# Patient Record
Sex: Female | Born: 1955 | Race: White | Hispanic: No | Marital: Married | Smoking: Former smoker
Health system: Southern US, Community
[De-identification: ages and names within clinical notes are randomized; demographics above are authoritative.]

## PROBLEM LIST (undated history)

## (undated) DIAGNOSIS — M549 Dorsalgia, unspecified: Secondary | ICD-10-CM

## (undated) HISTORY — PX: OTHER SURGICAL HISTORY: SHX169

## (undated) HISTORY — PX: KNEE ARTHROSCOPY WITH EXCISION BAKER'S CYST: SHX5646

---

## 1998-01-14 ENCOUNTER — Other Ambulatory Visit: Admission: RE | Admit: 1998-01-14 | Discharge: 1998-01-14 | Payer: Self-pay | Admitting: Gynecology

## 1999-01-19 ENCOUNTER — Other Ambulatory Visit: Admission: RE | Admit: 1999-01-19 | Discharge: 1999-01-19 | Payer: Self-pay | Admitting: Gynecology

## 2000-02-16 ENCOUNTER — Other Ambulatory Visit: Admission: RE | Admit: 2000-02-16 | Discharge: 2000-02-16 | Payer: Self-pay | Admitting: Gynecology

## 2001-02-27 ENCOUNTER — Other Ambulatory Visit: Admission: RE | Admit: 2001-02-27 | Discharge: 2001-02-27 | Payer: Self-pay | Admitting: Gynecology

## 2002-02-27 ENCOUNTER — Other Ambulatory Visit: Admission: RE | Admit: 2002-02-27 | Discharge: 2002-02-27 | Payer: Self-pay | Admitting: Gynecology

## 2003-03-11 ENCOUNTER — Other Ambulatory Visit: Admission: RE | Admit: 2003-03-11 | Discharge: 2003-03-11 | Payer: Self-pay | Admitting: Gynecology

## 2004-03-12 ENCOUNTER — Other Ambulatory Visit: Admission: RE | Admit: 2004-03-12 | Discharge: 2004-03-12 | Payer: Self-pay | Admitting: Gynecology

## 2005-03-15 ENCOUNTER — Other Ambulatory Visit: Admission: RE | Admit: 2005-03-15 | Discharge: 2005-03-15 | Payer: Self-pay | Admitting: Gynecology

## 2006-09-02 ENCOUNTER — Encounter: Admission: RE | Admit: 2006-09-02 | Discharge: 2006-09-02 | Payer: Self-pay | Admitting: Internal Medicine

## 2008-03-24 IMAGING — CR DG HAND COMPLETE 3+V*L*
3 series · 3 of 3 positions shown · non-contrast
Comparison: none

CLINICAL DATA: Pain left 1st and 2nd metacarpal bones. 
 LEFT HAND ? 3 VIEW:

[x hand pa left]
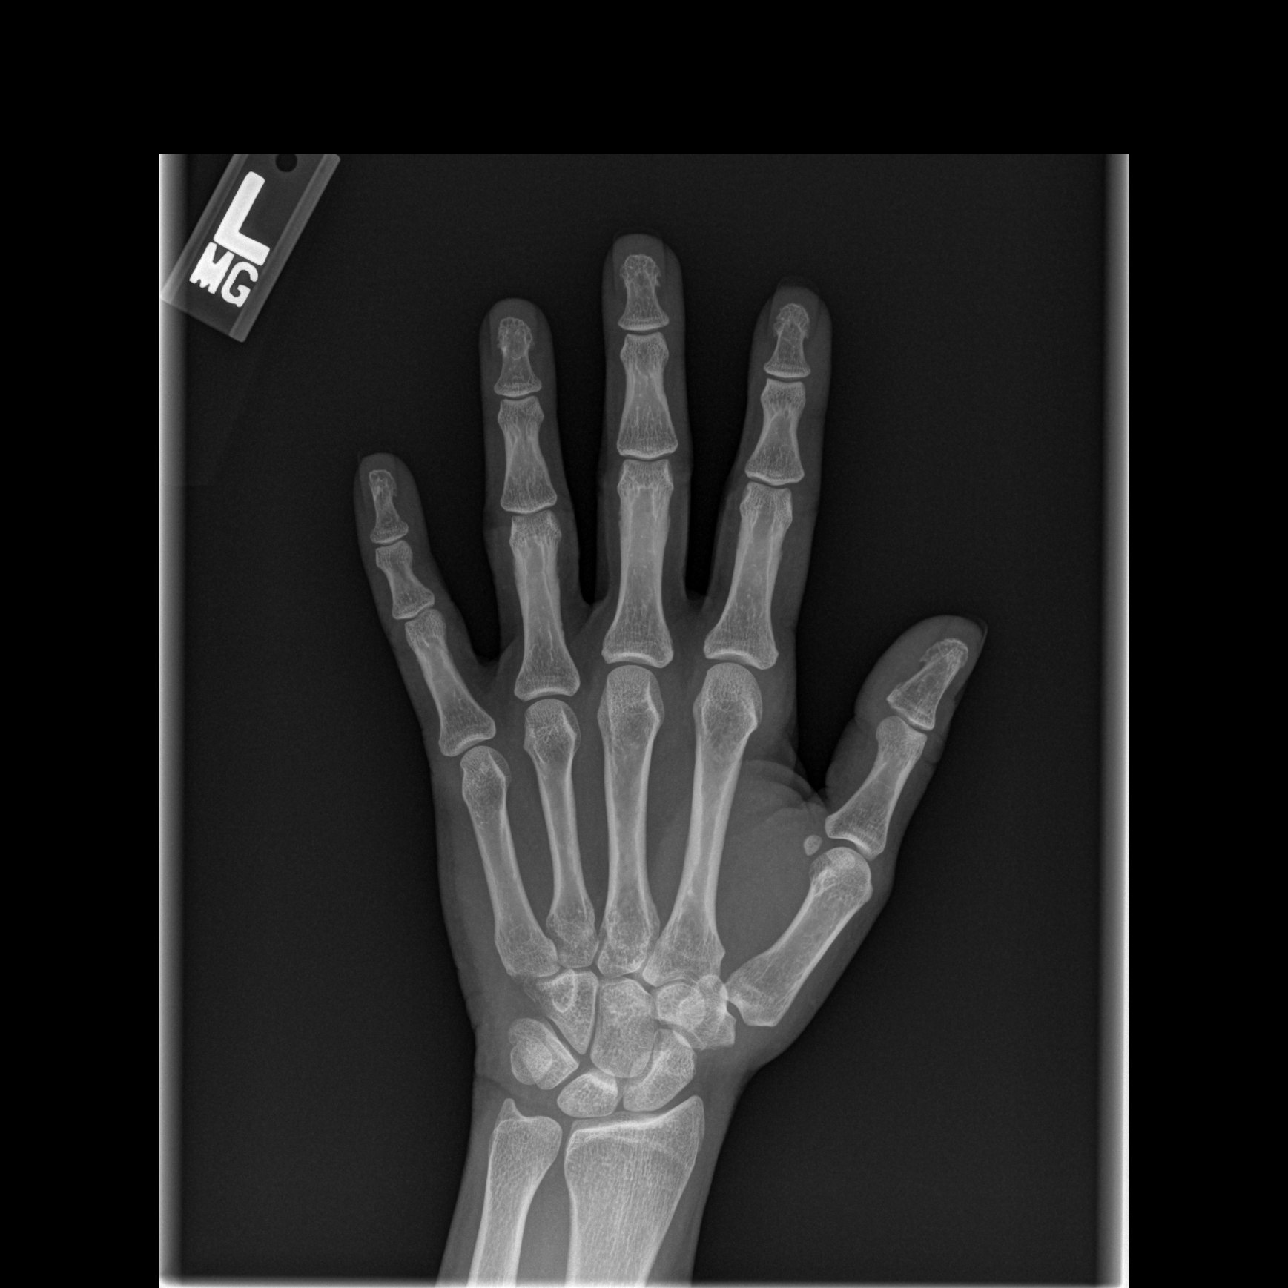

[x hand oblique left]
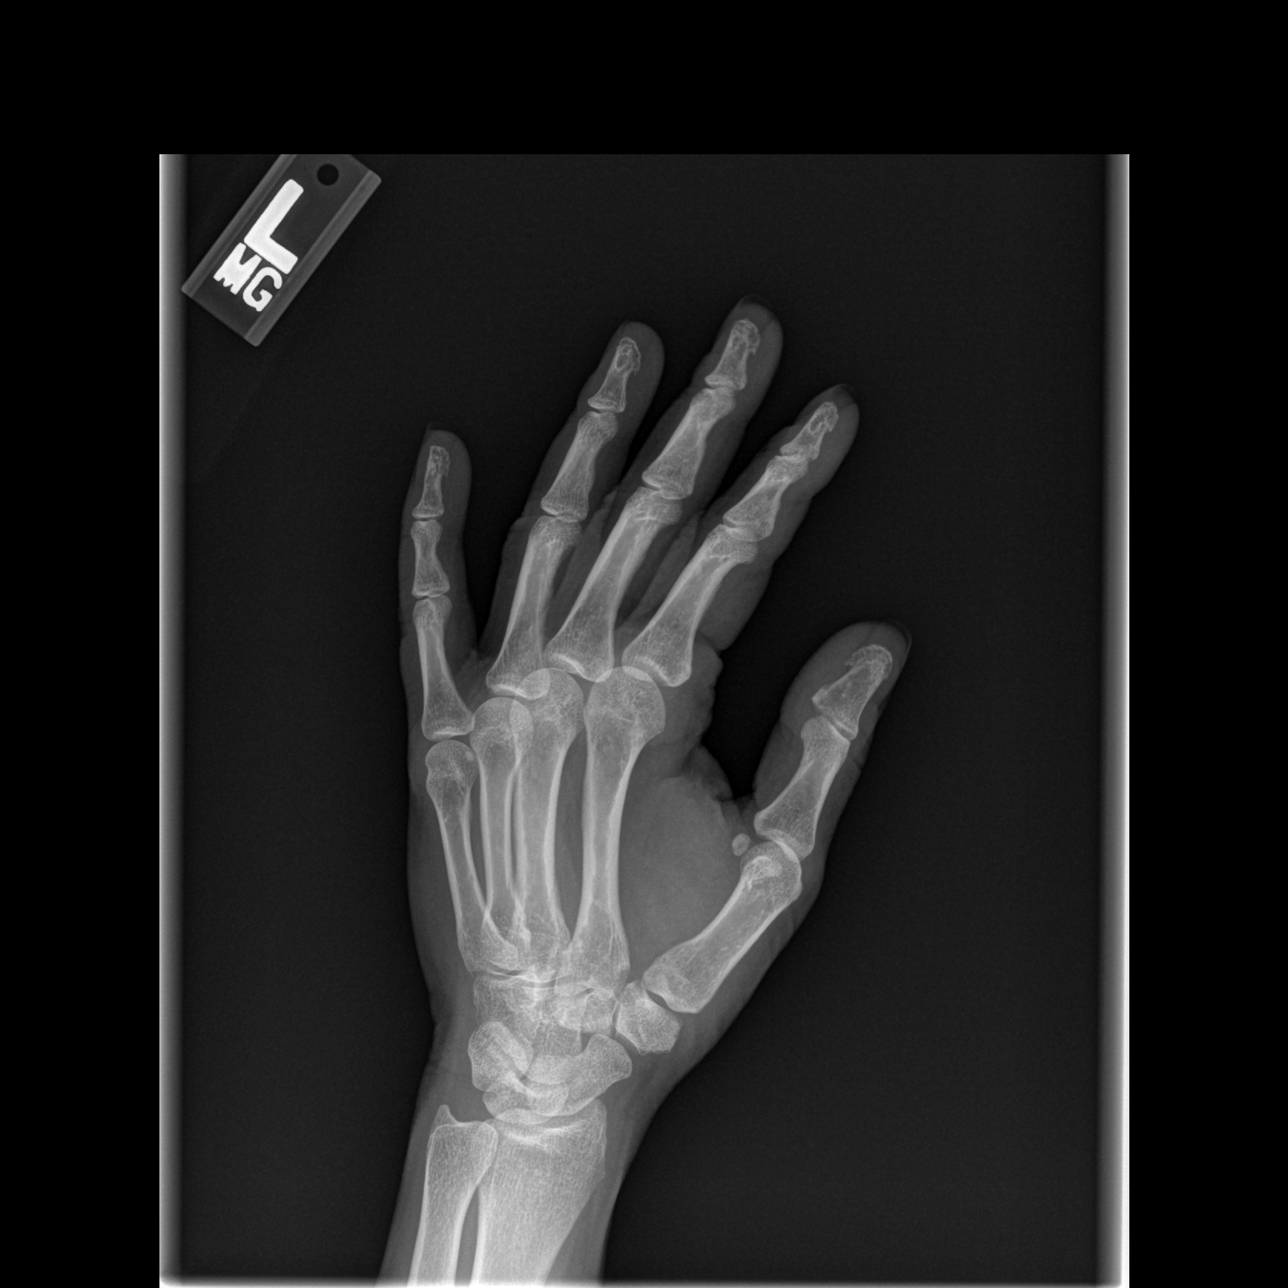

[x hand lat left]
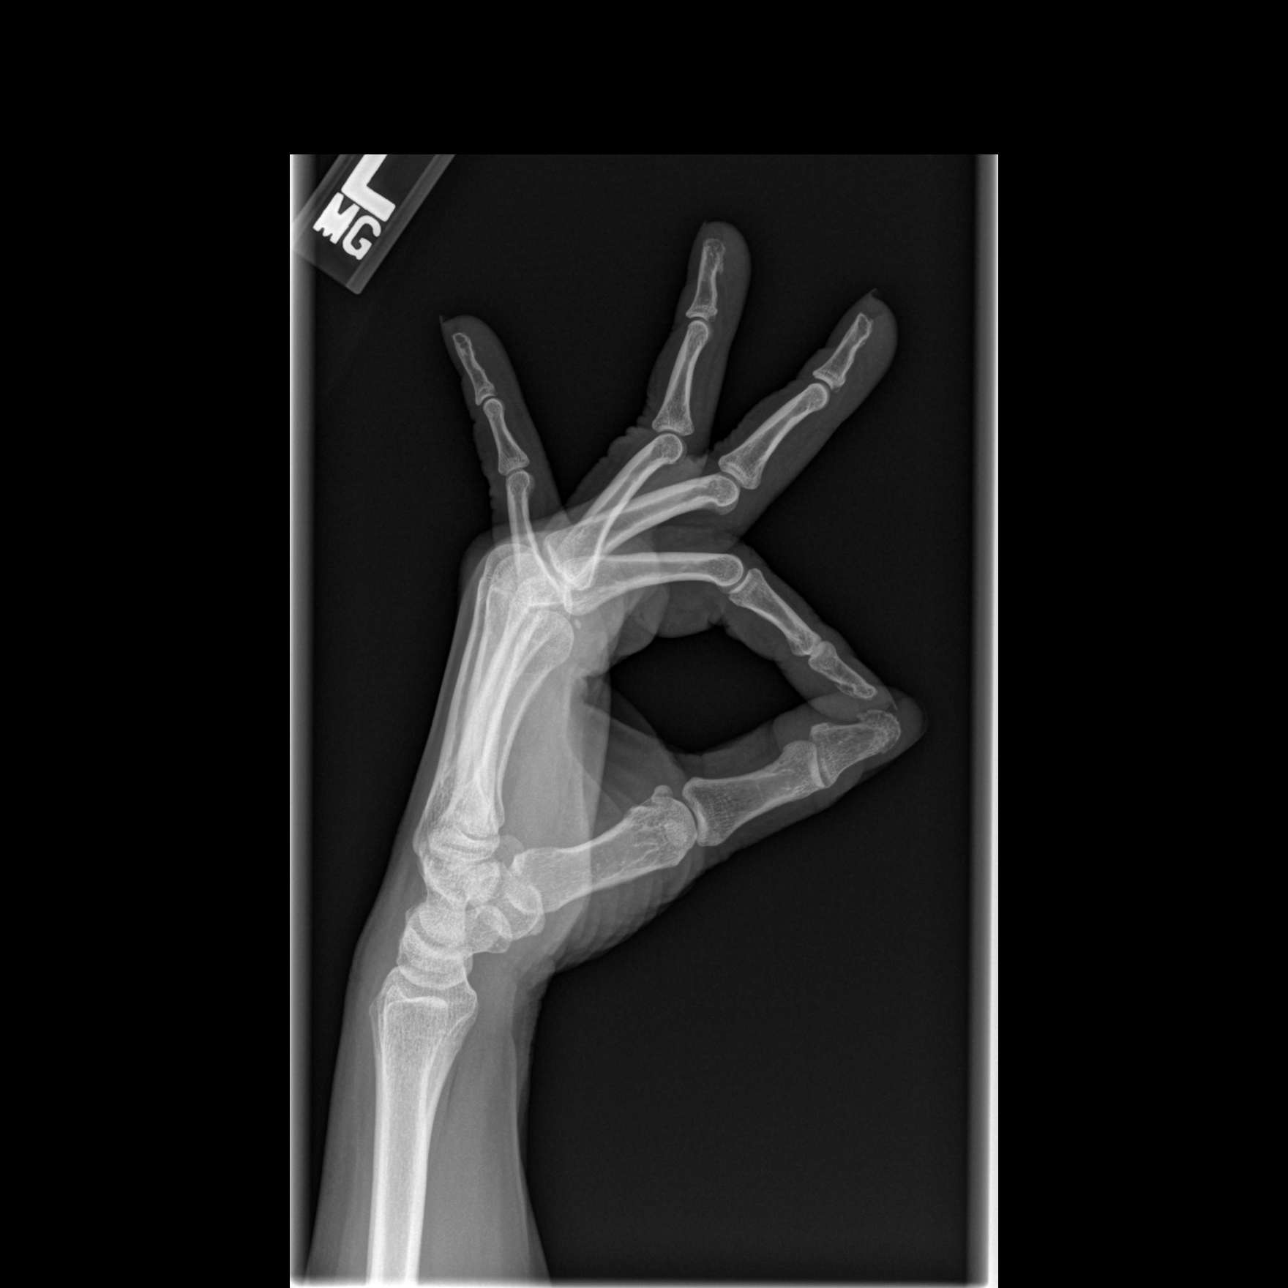

[3 of 3 positions shown; findings below may reference images not displayed]

FINDINGS: No acute bone or soft tissue abnormalities are present.
IMPRESSION: Negative left hand.

## 2008-03-24 IMAGING — CR DG PELVIS 1-2V
1 series · 1 of 1 positions shown · non-contrast
Comparison: none

CLINICAL DATA: 50-year-old female, left pelvic pain.
 PELVIS ? 1 VIEW:

[t pelvis a.p.]
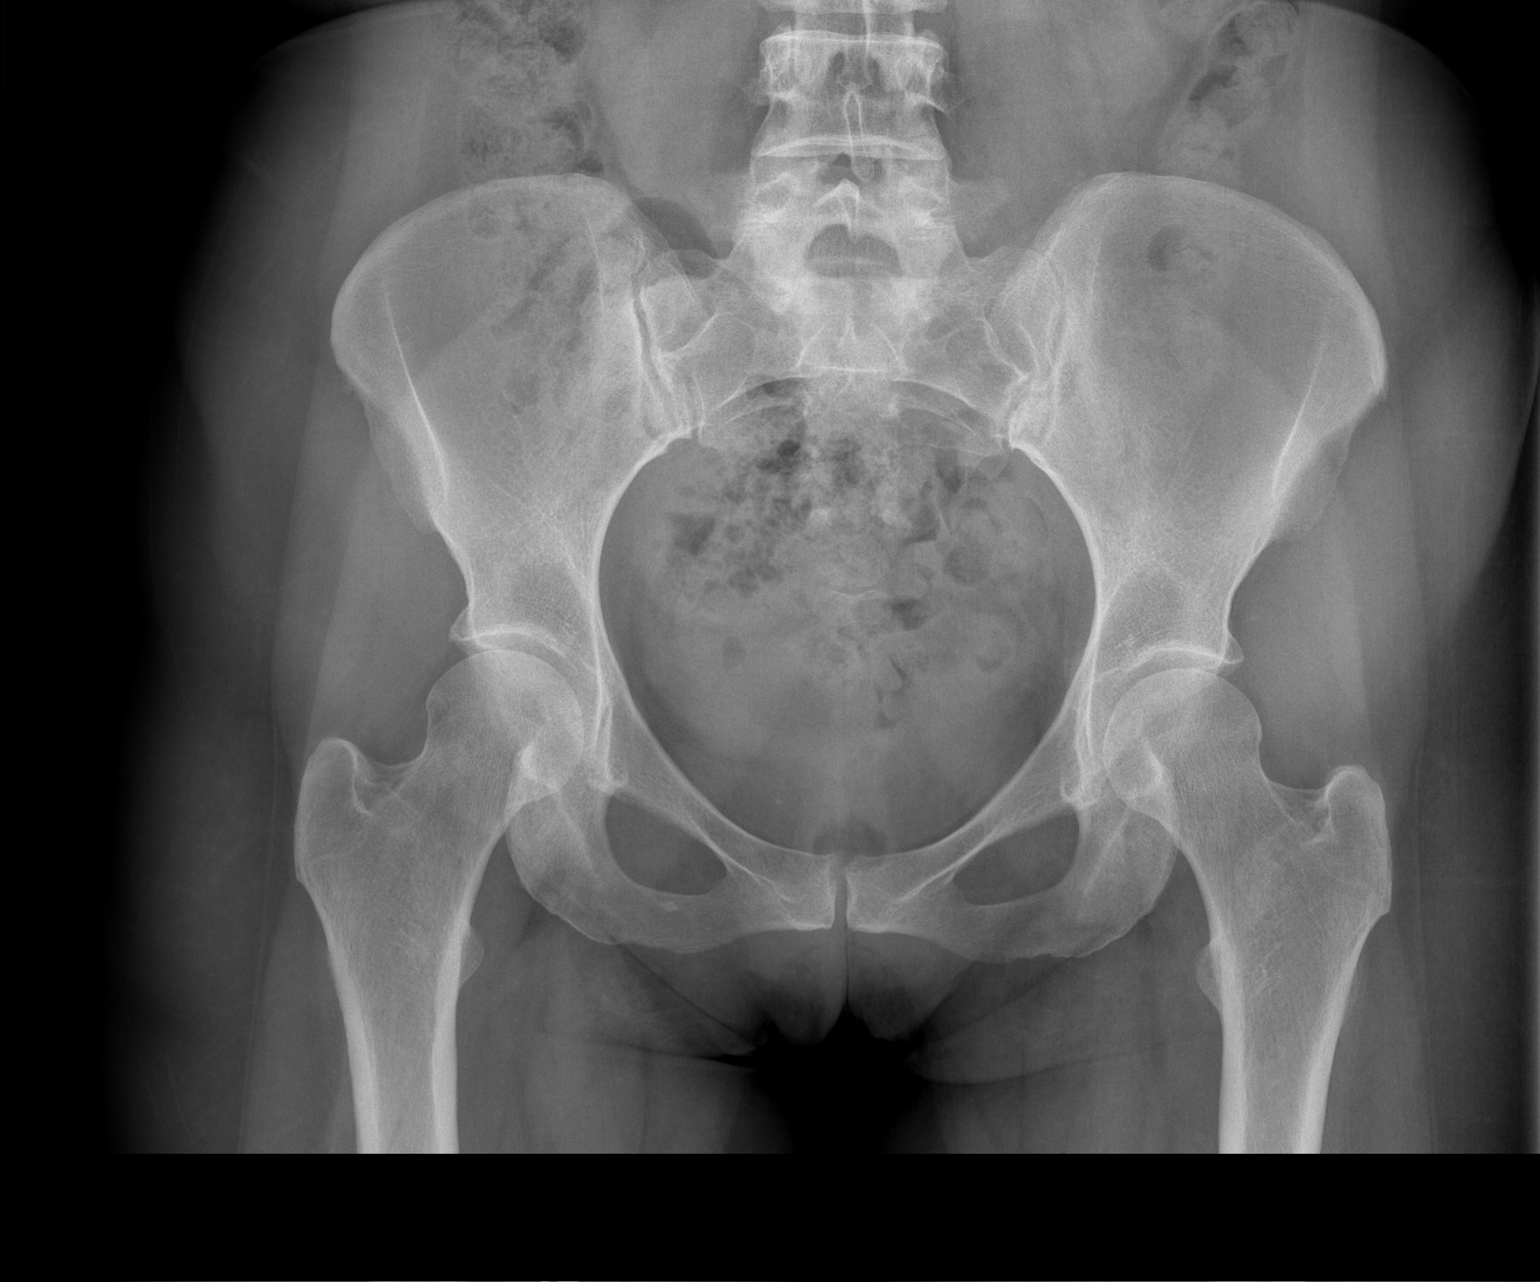

[1 of 1 positions shown; findings below may reference images not displayed]

FINDINGS: Hips are located.  No acute bone or soft tissue abnormality is identified.  Moderate amount of stool is seen throughout the colon.
IMPRESSION: Negative pelvis.

## 2008-07-25 ENCOUNTER — Encounter: Admission: RE | Admit: 2008-07-25 | Discharge: 2008-09-20 | Payer: Self-pay | Admitting: Orthopedic Surgery

## 2009-04-22 ENCOUNTER — Encounter: Admission: RE | Admit: 2009-04-22 | Discharge: 2009-04-22 | Payer: Self-pay | Admitting: Gynecology

## 2009-11-21 ENCOUNTER — Encounter: Admission: RE | Admit: 2009-11-21 | Discharge: 2009-11-21 | Payer: Self-pay | Admitting: Gynecology

## 2011-08-02 ENCOUNTER — Other Ambulatory Visit: Payer: Self-pay | Admitting: Orthopedic Surgery

## 2011-08-02 ENCOUNTER — Ambulatory Visit
Admission: RE | Admit: 2011-08-02 | Discharge: 2011-08-02 | Disposition: A | Discharge status: Home / Self Care | Payer: BC Managed Care – PPO | Source: Ambulatory Visit | Attending: Orthopedic Surgery | Admitting: Orthopedic Surgery

## 2011-08-02 DIAGNOSIS — IMO0002 Reserved for concepts with insufficient information to code with codable children: Secondary | ICD-10-CM

## 2012-01-13 ENCOUNTER — Other Ambulatory Visit (HOSPITAL_COMMUNITY): Payer: Self-pay | Admitting: Gynecology

## 2012-01-13 DIAGNOSIS — N950 Postmenopausal bleeding: Secondary | ICD-10-CM

## 2012-01-17 ENCOUNTER — Ambulatory Visit (HOSPITAL_COMMUNITY)
Admission: RE | Admit: 2012-01-17 | Discharge: 2012-01-17 | Disposition: A | Discharge status: Home / Self Care | Payer: BC Managed Care – PPO | Source: Ambulatory Visit | Attending: Gynecology | Admitting: Gynecology

## 2012-01-17 DIAGNOSIS — N838 Other noninflammatory disorders of ovary, fallopian tube and broad ligament: Secondary | ICD-10-CM | POA: Insufficient documentation

## 2012-01-17 DIAGNOSIS — N950 Postmenopausal bleeding: Secondary | ICD-10-CM | POA: Insufficient documentation

## 2012-01-17 DIAGNOSIS — N83209 Unspecified ovarian cyst, unspecified side: Secondary | ICD-10-CM | POA: Insufficient documentation

## 2012-04-13 ENCOUNTER — Ambulatory Visit: Payer: BC Managed Care – PPO | Admitting: Physical Therapy

## 2012-04-27 ENCOUNTER — Other Ambulatory Visit: Payer: Self-pay | Admitting: Gynecology

## 2012-04-27 DIAGNOSIS — R928 Other abnormal and inconclusive findings on diagnostic imaging of breast: Secondary | ICD-10-CM

## 2012-05-04 ENCOUNTER — Ambulatory Visit
Admission: RE | Admit: 2012-05-04 | Discharge: 2012-05-04 | Disposition: A | Discharge status: Home / Self Care | Payer: BC Managed Care – PPO | Source: Ambulatory Visit | Attending: Gynecology | Admitting: Gynecology

## 2012-05-04 DIAGNOSIS — R928 Other abnormal and inconclusive findings on diagnostic imaging of breast: Secondary | ICD-10-CM

## 2012-05-05 ENCOUNTER — Other Ambulatory Visit: Payer: BC Managed Care – PPO

## 2012-09-22 ENCOUNTER — Ambulatory Visit (INDEPENDENT_AMBULATORY_CARE_PROVIDER_SITE_OTHER): Payer: BC Managed Care – PPO | Admitting: Physical Therapy

## 2012-09-22 DIAGNOSIS — M47817 Spondylosis without myelopathy or radiculopathy, lumbosacral region: Secondary | ICD-10-CM

## 2012-09-22 DIAGNOSIS — M545 Low back pain, unspecified: Secondary | ICD-10-CM

## 2012-09-22 DIAGNOSIS — M2560 Stiffness of unspecified joint, not elsewhere classified: Secondary | ICD-10-CM

## 2012-09-22 DIAGNOSIS — M25559 Pain in unspecified hip: Secondary | ICD-10-CM

## 2012-09-25 ENCOUNTER — Encounter (INDEPENDENT_AMBULATORY_CARE_PROVIDER_SITE_OTHER): Payer: BC Managed Care – PPO | Admitting: Physical Therapy

## 2012-09-25 DIAGNOSIS — M545 Low back pain, unspecified: Secondary | ICD-10-CM

## 2012-09-25 DIAGNOSIS — M25559 Pain in unspecified hip: Secondary | ICD-10-CM

## 2012-09-25 DIAGNOSIS — M2560 Stiffness of unspecified joint, not elsewhere classified: Secondary | ICD-10-CM

## 2012-09-25 DIAGNOSIS — M47817 Spondylosis without myelopathy or radiculopathy, lumbosacral region: Secondary | ICD-10-CM

## 2012-10-05 ENCOUNTER — Encounter (INDEPENDENT_AMBULATORY_CARE_PROVIDER_SITE_OTHER): Payer: BC Managed Care – PPO | Admitting: Physical Therapy

## 2012-10-05 DIAGNOSIS — M545 Low back pain, unspecified: Secondary | ICD-10-CM

## 2012-10-05 DIAGNOSIS — M47817 Spondylosis without myelopathy or radiculopathy, lumbosacral region: Secondary | ICD-10-CM

## 2012-10-05 DIAGNOSIS — M2560 Stiffness of unspecified joint, not elsewhere classified: Secondary | ICD-10-CM

## 2012-10-05 DIAGNOSIS — M25559 Pain in unspecified hip: Secondary | ICD-10-CM

## 2012-10-09 ENCOUNTER — Encounter (INDEPENDENT_AMBULATORY_CARE_PROVIDER_SITE_OTHER): Payer: BC Managed Care – PPO | Admitting: Physical Therapy

## 2012-10-09 DIAGNOSIS — M47817 Spondylosis without myelopathy or radiculopathy, lumbosacral region: Secondary | ICD-10-CM

## 2012-10-09 DIAGNOSIS — M25559 Pain in unspecified hip: Secondary | ICD-10-CM

## 2012-10-09 DIAGNOSIS — M545 Low back pain, unspecified: Secondary | ICD-10-CM

## 2012-10-09 DIAGNOSIS — M2560 Stiffness of unspecified joint, not elsewhere classified: Secondary | ICD-10-CM

## 2012-10-12 ENCOUNTER — Encounter (INDEPENDENT_AMBULATORY_CARE_PROVIDER_SITE_OTHER): Payer: BC Managed Care – PPO | Admitting: Physical Therapy

## 2012-10-12 DIAGNOSIS — M47817 Spondylosis without myelopathy or radiculopathy, lumbosacral region: Secondary | ICD-10-CM

## 2012-10-12 DIAGNOSIS — M25559 Pain in unspecified hip: Secondary | ICD-10-CM

## 2012-10-12 DIAGNOSIS — M2560 Stiffness of unspecified joint, not elsewhere classified: Secondary | ICD-10-CM

## 2012-10-12 DIAGNOSIS — M545 Low back pain, unspecified: Secondary | ICD-10-CM

## 2012-10-19 ENCOUNTER — Encounter (INDEPENDENT_AMBULATORY_CARE_PROVIDER_SITE_OTHER): Payer: BC Managed Care – PPO | Admitting: Physical Therapy

## 2012-10-19 DIAGNOSIS — M47817 Spondylosis without myelopathy or radiculopathy, lumbosacral region: Secondary | ICD-10-CM

## 2012-10-19 DIAGNOSIS — M2560 Stiffness of unspecified joint, not elsewhere classified: Secondary | ICD-10-CM

## 2012-10-19 DIAGNOSIS — M545 Low back pain, unspecified: Secondary | ICD-10-CM

## 2012-10-19 DIAGNOSIS — M25559 Pain in unspecified hip: Secondary | ICD-10-CM

## 2012-10-23 ENCOUNTER — Encounter: Payer: BC Managed Care – PPO | Admitting: Physical Therapy

## 2012-10-26 ENCOUNTER — Encounter: Payer: BC Managed Care – PPO | Admitting: Physical Therapy

## 2012-10-30 ENCOUNTER — Encounter: Payer: BC Managed Care – PPO | Admitting: Physical Therapy

## 2012-11-03 ENCOUNTER — Encounter: Payer: BC Managed Care – PPO | Admitting: Physical Therapy

## 2012-11-06 ENCOUNTER — Encounter: Payer: BC Managed Care – PPO | Admitting: Physical Therapy

## 2013-05-08 ENCOUNTER — Other Ambulatory Visit: Payer: Self-pay | Admitting: Obstetrics and Gynecology

## 2013-05-09 ENCOUNTER — Encounter (HOSPITAL_COMMUNITY): Payer: Self-pay | Admitting: Pharmacist

## 2013-05-10 ENCOUNTER — Encounter (HOSPITAL_COMMUNITY)
Admission: RE | Admit: 2013-05-10 | Discharge: 2013-05-10 | Disposition: A | Discharge status: Home / Self Care | Payer: BC Managed Care – PPO | Source: Ambulatory Visit | Attending: Obstetrics and Gynecology | Admitting: Obstetrics and Gynecology

## 2013-05-10 ENCOUNTER — Encounter (HOSPITAL_COMMUNITY): Payer: Self-pay

## 2013-05-10 DIAGNOSIS — Z01812 Encounter for preprocedural laboratory examination: Secondary | ICD-10-CM | POA: Insufficient documentation

## 2013-05-10 HISTORY — DX: Dorsalgia, unspecified: M54.9

## 2013-05-10 LAB — CBC
HCT: 41.5 % (ref 36.0–46.0)
MCH: 33 pg (ref 26.0–34.0)
MCHC: 34.2 g/dL (ref 30.0–36.0)
MCV: 96.5 fL (ref 78.0–100.0)
RBC: 4.3 MIL/uL (ref 3.87–5.11)

## 2013-05-10 LAB — BASIC METABOLIC PANEL
Calcium: 9.4 mg/dL (ref 8.4–10.5)
Creatinine, Ser: 0.52 mg/dL (ref 0.50–1.10)
GFR calc Af Amer: >90 mL/min (ref >90)
GFR calc non Af Amer: >90 mL/min (ref >90)
Glucose, Bld: 86 mg/dL (ref 70–99)
Potassium: 3.7 mEq/L (ref 3.5–5.1)
Sodium: 141 mEq/L (ref 135–145)

## 2013-05-10 NOTE — Patient Instructions (Signed)
21 Gloria Roberts  05/10/2013   Your procedure is scheduled on:  05/14/13  Enter through the Main Entrance of [facility] at 10 AM.  Pick up the phone at the desk and dial [phone].   Call this number if you have problems the morning of surgery: [phone]   Remember:   Do not eat food:After Midnight.  Do not drink clear liquids: After Midnight.  Take these medicines the morning of surgery with A SIP OF WATER: NA   Do not wear jewelry, make-up or nail polish.  Do not wear lotions, powders, or perfumes. You may wear deodorant.  Do not shave 48 hours prior to surgery.  Do not bring valuables to the hospital.  [facility] is not   responsible for any belongings or valuables brought to the hospital.  Contacts, dentures or bridgework may not be worn into surgery.  Leave suitcase in the car. After surgery it may be brought to your room.  For patients admitted to the hospital, checkout time is 11:00 AM the day of              discharge.   Patients discharged the day of surgery will not be allowed to drive             home.  Name and phone number of your driver: husband  [name]  Special Instructions:   Shower using CHG 2 nights before surgery and the night before surgery.  If you shower the day of surgery use CHG.  Use special wash - you have one bottle of CHG for all showers.  You should use approximately 1/3 of the bottle for each shower.   Please read over the following fact sheets that you were given:   Surgical Site Infection Prevention

## 2013-05-14 ENCOUNTER — Encounter (HOSPITAL_COMMUNITY): Payer: BC Managed Care – PPO | Admitting: Anesthesiology

## 2013-05-14 ENCOUNTER — Ambulatory Visit (HOSPITAL_COMMUNITY): Payer: BC Managed Care – PPO | Admitting: Anesthesiology

## 2013-05-14 ENCOUNTER — Encounter (HOSPITAL_COMMUNITY): Payer: Self-pay | Admitting: Anesthesiology

## 2013-05-14 ENCOUNTER — Ambulatory Visit (HOSPITAL_COMMUNITY)
Admission: RE | Admit: 2013-05-14 | Discharge: 2013-05-14 | Disposition: A | Discharge status: Home / Self Care | Payer: BC Managed Care – PPO | Source: Ambulatory Visit | Attending: Obstetrics and Gynecology | Admitting: Obstetrics and Gynecology

## 2013-05-14 ENCOUNTER — Encounter (HOSPITAL_COMMUNITY)
Admission: RE | Disposition: A | Discharge status: Home / Self Care | Payer: Self-pay | Source: Ambulatory Visit | Attending: Obstetrics and Gynecology

## 2013-05-14 DIAGNOSIS — N950 Postmenopausal bleeding: Secondary | ICD-10-CM

## 2013-05-14 DIAGNOSIS — N841 Polyp of cervix uteri: Secondary | ICD-10-CM | POA: Insufficient documentation

## 2013-05-14 HISTORY — PX: HYSTEROSCOPY WITH D & C: SHX1775

## 2013-05-14 SURGERY — DILATATION AND CURETTAGE /HYSTEROSCOPY
Anesthesia: General | Site: Vagina

## 2013-05-14 MED ORDER — KETOROLAC TROMETHAMINE 30 MG/ML IJ SOLN
INTRAMUSCULAR | Status: DC | PRN
  Administered 2013-05-14: 30 mg via INTRAMUSCULAR

## 2013-05-14 MED ORDER — ONDANSETRON HCL 4 MG/2ML IJ SOLN
INTRAMUSCULAR | Status: DC | PRN
  Administered 2013-05-14: 4 mg via INTRAVENOUS

## 2013-05-14 MED ORDER — LIDOCAINE HCL 1 % IJ SOLN
INTRAMUSCULAR | Status: DC | PRN
  Administered 2013-05-14: 10 mL

## 2013-05-14 MED ORDER — PROPOFOL 10 MG/ML IV BOLUS
INTRAVENOUS | Status: DC | PRN
  Administered 2013-05-14: 180 mg via INTRAVENOUS

## 2013-05-14 MED ORDER — GLYCINE 1.5 % IR SOLN
Status: DC | PRN
  Administered 2013-05-14: 3000 mL

## 2013-05-14 MED ORDER — LACTATED RINGERS IV SOLN
INTRAVENOUS | Status: DC
  Administered 2013-05-14: 11:00:00 via INTRAVENOUS

## 2013-05-14 MED ORDER — FENTANYL CITRATE 0.05 MG/ML IJ SOLN
INTRAMUSCULAR | Status: AC
  Filled 2013-05-14: qty 2

## 2013-05-14 MED ORDER — KETOROLAC TROMETHAMINE 30 MG/ML IJ SOLN
INTRAMUSCULAR | Status: AC
  Filled 2013-05-14: qty 1

## 2013-05-14 MED ORDER — MIDAZOLAM HCL 2 MG/2ML IJ SOLN
INTRAMUSCULAR | Status: AC
  Filled 2013-05-14: qty 2

## 2013-05-14 MED ORDER — LIDOCAINE HCL 1 % IJ SOLN
INTRAMUSCULAR | Status: AC
  Filled 2013-05-14: qty 20

## 2013-05-14 MED ORDER — LIDOCAINE HCL (CARDIAC) 20 MG/ML IV SOLN
INTRAVENOUS | Status: AC
  Filled 2013-05-14: qty 5

## 2013-05-14 MED ORDER — PROPOFOL 10 MG/ML IV EMUL
INTRAVENOUS | Status: AC
  Filled 2013-05-14: qty 20

## 2013-05-14 MED ORDER — MIDAZOLAM HCL 2 MG/2ML IJ SOLN
INTRAMUSCULAR | Status: DC | PRN
  Administered 2013-05-14: 2 mg via INTRAVENOUS

## 2013-05-14 MED ORDER — KETOROLAC TROMETHAMINE 30 MG/ML IJ SOLN: 15.0000 mg | Freq: Once | INTRAMUSCULAR | Status: DC | PRN

## 2013-05-14 MED ORDER — ONDANSETRON HCL 4 MG/2ML IJ SOLN
INTRAMUSCULAR | Status: AC
  Filled 2013-05-14: qty 2

## 2013-05-14 MED ORDER — CEFAZOLIN SODIUM-DEXTROSE 2-3 GM-% IV SOLR
2.0000 g | INTRAVENOUS | Status: AC
  Administered 2013-05-14: 2 g via INTRAVENOUS

## 2013-05-14 MED ORDER — FENTANYL CITRATE 0.05 MG/ML IJ SOLN
INTRAMUSCULAR | Status: DC | PRN
  Administered 2013-05-14 (×4): 50 ug via INTRAVENOUS

## 2013-05-14 MED ORDER — METOCLOPRAMIDE HCL 5 MG/ML IJ SOLN: 10.0000 mg | Freq: Once | INTRAMUSCULAR | Status: DC | PRN

## 2013-05-14 MED ORDER — LIDOCAINE HCL (CARDIAC) 20 MG/ML IV SOLN
INTRAVENOUS | Status: DC | PRN
  Administered 2013-05-14: 60 mg via INTRAVENOUS

## 2013-05-14 MED ORDER — FENTANYL CITRATE 0.05 MG/ML IJ SOLN: 25.0000 ug | INTRAMUSCULAR | Status: DC | PRN

## 2013-05-14 MED ORDER — LACTATED RINGERS IV SOLN
INTRAVENOUS | Status: DC | PRN
  Administered 2013-05-14 (×3): via INTRAVENOUS

## 2013-05-14 MED ORDER — SCOPOLAMINE 1 MG/3DAYS TD PT72
MEDICATED_PATCH | TRANSDERMAL | Status: AC
  Administered 2013-05-14: 1.5 mg
  Filled 2013-05-14: qty 1

## 2013-05-14 MED ORDER — CEFAZOLIN SODIUM-DEXTROSE 2-3 GM-% IV SOLR
INTRAVENOUS | Status: AC
  Filled 2013-05-14: qty 50

## 2013-05-14 SURGICAL SUPPLY — 16 items
CANISTER SUCT 3000ML (MISCELLANEOUS) ×2 IMPLANT
CATH ROBINSON RED A/P 16FR (CATHETERS) ×2 IMPLANT
CLOTH BEACON ORANGE TIMEOUT ST (SAFETY) ×2 IMPLANT
CONTAINER PREFILL 10% NBF 60ML (FORM) ×4 IMPLANT
DRSG TELFA 3X8 NADH (GAUZE/BANDAGES/DRESSINGS) ×2 IMPLANT
ELECT REM PT RETURN 9FT ADLT (ELECTROSURGICAL) ×2
ELECTRODE REM PT RTRN 9FT ADLT (ELECTROSURGICAL) ×1 IMPLANT
GLOVE ECLIPSE 7.0 STRL STRAW (GLOVE) ×4 IMPLANT
GOWN PREVENTION PLUS XLARGE (GOWN DISPOSABLE) ×2 IMPLANT
GOWN STRL REIN XL XLG (GOWN DISPOSABLE) ×4 IMPLANT
LOOP ANGLED CUTTING 22FR (CUTTING LOOP) IMPLANT
PACK HYSTEROSCOPY LF (CUSTOM PROCEDURE TRAY) ×2 IMPLANT
PAD DRESSING TELFA 3X8 NADH (GAUZE/BANDAGES/DRESSINGS) ×1 IMPLANT
PAD OB MATERNITY 4.3X12.25 (PERSONAL CARE ITEMS) ×2 IMPLANT
TOWEL OR 17X24 6PK STRL BLUE (TOWEL DISPOSABLE) ×4 IMPLANT
WATER STERILE IRR 1000ML POUR (IV SOLUTION) ×2 IMPLANT

## 2013-05-14 NOTE — Transfer of Care (Signed)
Immediate Anesthesia Transfer of Care Note  Patient: Gloria Roberts  Procedure(s) Performed: Procedure(s): DILATATION AND CURETTAGE /HYSTEROSCOPY (N/A)  Patient Location: PACU  Anesthesia Type:General  Level of Consciousness: awake, alert , oriented and patient cooperative  Airway & Oxygen Therapy: Patient Spontanous Breathing and Patient connected to nasal cannula oxygen  Post-op Assessment: Report given to PACU RN  Post vital signs: Reviewed and stable  Complications: No apparent anesthesia complications

## 2013-05-14 NOTE — H&P (Signed)
Gloria Roberts is an 57 y.o. who presents to the OR for a hysteroscopy/D&C for postmenopausal bleeding. She has had an ablation in the past. She has had no bleeding in the last month. She only has spotting.   Chief Complaint: HPI:  Past Medical History  Diagnosis Date  . Back pain     lower lumbar    Past Surgical History  Procedure Laterality Date  . Cesarean section  1979  . Knee arthroscopy with excision baker's cyst      left knee  . Lumbar radio frequency neuyrotomy  12/3012    No family history on file. Social History:  reports that she has quit smoking. She does not have any smokeless tobacco history on file. She reports that she drinks alcohol. She reports that she does not use illicit drugs.  Allergies:  Allergies  Allergen Reactions  . Prednisone Other (See Comments)    Hypersensitivity    Medications Prior to Admission  Medication Sig Dispense Refill  . ammonium lactate (LAC-HYDRIN) 12 % cream Apply topically as needed for dry skin (to feet).      Marland Kitchen aspirin 81 MG tablet Take 81 mg by mouth daily. Has stopped this med recently for her upcoming procedure      . fluticasone (FLONASE) 50 MCG/ACT nasal spray Place 2 sprays into both nostrils as needed for allergies or rhinitis. Can use this med with her allergy      . GARCINIA CAMBOGIA-CHROMIUM PO Take 1 capsule by mouth daily. Pt has stopped recently for her up coming procedure.      . meloxicam (MOBIC) 15 MG tablet Take 15 mg by mouth daily. Has stopped this med recently for her upcoming procedure      . Multiple Vitamin (MULTIVITAMIN WITH MINERALS) TABS tablet Take 1 tablet by mouth daily.      . Psyllium Husk 100 % POWD Take 1 packet by mouth daily.      Marland Kitchen triamterene-hydrochlorothiazide (MAXZIDE) 75-50 MG per tablet Take 0.25 tablets by mouth daily as needed. Pt takes when she feels bloated.      . vitamin C (ASCORBIC ACID) 500 MG tablet Take 500 mg by mouth 2 (two) times daily.         Pertinent items are noted  in HPI.  Blood pressure 128/63, temperature 97.5 F (36.4 C), temperature source Oral, resp. rate 18, height 5' (1.524 m), weight 49.896 kg (110 lb), SpO2 100.00%. BP 128/63  Temp(Src) 97.5 F (36.4 C) (Oral)  Resp 18  Ht 5' (1.524 m)  Wt 49.896 kg (110 lb)  BMI 21.48 kg/m2  SpO2 100% Abdomen: soft, non-tender; bowel sounds normal; no masses,  no organomegaly Pelvic: cervix normal in appearance, no adnexal masses or tenderness, no cervical motion tenderness, rectovaginal septum normal, uterus normal size, shape, and consistency and vagina normal without discharge   Lab Results  Component Value Date   WBC 7.9 05/10/2013   HGB 14.2 05/10/2013   HCT 41.5 05/10/2013   MCV 96.5 05/10/2013   PLT 228 05/10/2013   No results found for this basename: PREGTESTUR, PREGSERUM, HCG, HCGQUANT     Assessment/Plan Proceed with Ablation  Post menopausal bleeding, Will proceed with hysteroscopy and D&C.  ANDERSON,MARK E 05/14/2013, 11:16 AM

## 2013-05-14 NOTE — Anesthesia Procedure Notes (Signed)
Procedure Name: LMA Insertion Date/Time: 05/14/2013 11:31 AM Performed by: Graciela Husbands Pre-anesthesia Checklist: Timeout performed, Patient identified, Emergency Drugs available, Suction available and Patient being monitored Patient Re-evaluated:Patient Re-evaluated prior to inductionOxygen Delivery Method: Circle system utilized Preoxygenation: Pre-oxygenation with 100% oxygen Intubation Type: IV induction Ventilation: Mask ventilation without difficulty LMA: LMA inserted LMA Size: 4.0 Number of attempts: 1 Placement Confirmation: breath sounds checked- equal and bilateral and positive ETCO2 Tube secured with: Tape Dental Injury: Teeth and Oropharynx as per pre-operative assessment

## 2013-05-14 NOTE — Anesthesia Postprocedure Evaluation (Signed)
  Anesthesia Post-op Note  Anesthesia Post Note  Patient: Gloria Roberts  Procedure(s) Performed: Procedure(s) (LRB): DILATATION AND CURETTAGE /HYSTEROSCOPY (N/A)  Anesthesia type: General  Patient location: PACU  Post pain: Pain level controlled  Post assessment: Post-op Vital signs reviewed  Last Vitals:  Filed Vitals:   05/14/13 1300  BP:   Pulse:   Temp: 36.3 C  Resp:     Post vital signs: Reviewed  Level of consciousness: sedated  Complications: No apparent anesthesia complications

## 2013-05-14 NOTE — Anesthesia Preprocedure Evaluation (Signed)
Anesthesia Evaluation  Patient identified by MRN, date of birth, ID band Patient awake    Reviewed: Allergy & Precautions, H&P , NPO status , Patient's Chart, lab work & pertinent test results, reviewed documented beta blocker date and time   History of Anesthesia Complications (+) PONV  Airway Mallampati: I TM Distance: >3 FB Neck ROM: full    Dental no notable dental hx. (+) Teeth Intact   Pulmonary neg pulmonary ROS, former smoker,  breath sounds clear to auscultation  Pulmonary exam normal       Cardiovascular Exercise Tolerance: Good Rhythm:regular Rate:Normal     Neuro/Psych Chronic lower back pain - mobic prn negative psych ROS   GI/Hepatic negative GI ROS, Neg liver ROS,   Endo/Other  negative endocrine ROS  Renal/GU negative Renal ROS  Female GU complaint     Musculoskeletal   Abdominal Normal abdominal exam  (+)   Peds  Hematology negative hematology ROS (+)   Anesthesia Other Findings   Reproductive/Obstetrics negative OB ROS                           Anesthesia Physical Anesthesia Plan  ASA: I  Anesthesia Plan: General LMA   Post-op Pain Management:    Induction:   Airway Management Planned:   Additional Equipment:   Intra-op Plan:   Post-operative Plan:   Informed Consent: I have reviewed the patients History and Physical, chart, labs and discussed the procedure including the risks, benefits and alternatives for the proposed anesthesia with the patient or authorized representative who has indicated his/her understanding and acceptance.   Dental Advisory Given  Plan Discussed with: CRNA and Surgeon  Anesthesia Plan Comments:         Anesthesia Quick Evaluation

## 2013-05-15 ENCOUNTER — Encounter (HOSPITAL_COMMUNITY): Payer: Self-pay | Admitting: Obstetrics and Gynecology

## 2013-05-25 NOTE — Op Note (Signed)
NAMTraci Sermon:  Kawa, Abb[place]ail            ACCOUNT NO.:  [id number]  MEDICAL RECORD NO.:  [id number]  LOCATION:  [facility]                          FACILITY:  [facility]  PHYSICIAN:  Malva LimesMark Fianna Snowball, M.D.    DATE OF BIRTH:  1956-02-19  DATE OF PROCEDURE: DATE OF DISCHARGE:  05/14/2013                              OPERATIVE REPORT   PREOPERATIVE DIAGNOSIS:  Postmenopausal bleeding.  POSTOPERATIVE DIAGNOSIS:  Postmenopausal bleeding with endocervical polyp.  SURGEON:  Malva LimesMark Sabastion Hrdlicka, M.D.  ASSISTANT:  Luvenia ReddenW. Scott Bowie, M.D.  ANESTHESIA:  General and local antibiotics, Ancef 2 g.  DRAINS:  Red rubber catheter bladder.  SPECIMENS:  Endocervical polyp sent to Pathology.  PROCEDURE IN DETAIL:  The patient was taken to the operating room, where general anesthetic was administered without difficulty.  She was placed in dorsal lithotomy position.  She was prepped and draped in usual fashion for this procedure.  An exam under anesthesia revealed no evidence of any pelvic masses.  Uterus appeared to be anteverted and small.  A weighted speculum placed in vagina and her cervix was injected with 1% lidocaine.  A single-tooth tenaculum was applied to the anterior cervical lip.  The cervix was serially dilated to a 27-French.  The hysteroscope was advanced through the endocervical canal, where a polyp was seen to be arising from approximately 12 o'clock position.  On entering the uterine cavity, the patient had no evidence of any tissue growth.  Polyps or submucous fibroids.  The patient did have a past NovaSure procedure and these findings were consistent with that.  There was no evidence of any bleeding inside the uterus.  The ostia were not visualized.  At this point, the scissors were passed through the scope and the base of the polyp was excised.  The polyp was then removed and sent to Pathology.  There is no significant bleeding noted after this. A complete hysteroscopy was again performed and no other  lesions were identified.  At this point, a D and C was performed.  Minimal amount of tissue was sent to Pathology.  This concluded the procedure.  She was taken to Recovery Room in stable condition.  Instrument and lap count was correct x1.  Patient was discharged home.  She will follow up in the office in 4 weeks.          ______________________________ Malva LimesMark Yasmin Dibello, M.D.    MA/MEDQ  D:  05/24/2013  T:  05/25/2013  Job:  [id number]

## 2013-07-20 ENCOUNTER — Ambulatory Visit (INDEPENDENT_AMBULATORY_CARE_PROVIDER_SITE_OTHER): Payer: BC Managed Care – PPO | Admitting: Physical Therapy

## 2013-07-20 DIAGNOSIS — M2550 Pain in unspecified joint: Secondary | ICD-10-CM

## 2013-07-20 DIAGNOSIS — M7710 Lateral epicondylitis, unspecified elbow: Secondary | ICD-10-CM

## 2013-07-20 DIAGNOSIS — M7700 Medial epicondylitis, unspecified elbow: Secondary | ICD-10-CM

## 2013-07-30 ENCOUNTER — Encounter (INDEPENDENT_AMBULATORY_CARE_PROVIDER_SITE_OTHER): Payer: BC Managed Care – PPO | Admitting: Physical Therapy

## 2013-07-30 DIAGNOSIS — M7700 Medial epicondylitis, unspecified elbow: Secondary | ICD-10-CM

## 2013-07-30 DIAGNOSIS — M7710 Lateral epicondylitis, unspecified elbow: Secondary | ICD-10-CM

## 2013-07-30 DIAGNOSIS — M2550 Pain in unspecified joint: Secondary | ICD-10-CM

## 2013-08-06 ENCOUNTER — Other Ambulatory Visit: Payer: BC Managed Care – PPO | Admitting: Internal Medicine

## 2013-08-06 ENCOUNTER — Encounter (INDEPENDENT_AMBULATORY_CARE_PROVIDER_SITE_OTHER): Payer: BC Managed Care – PPO | Admitting: Physical Therapy

## 2013-08-06 DIAGNOSIS — M2550 Pain in unspecified joint: Secondary | ICD-10-CM

## 2013-08-06 DIAGNOSIS — M7700 Medial epicondylitis, unspecified elbow: Secondary | ICD-10-CM

## 2013-08-06 DIAGNOSIS — M7710 Lateral epicondylitis, unspecified elbow: Secondary | ICD-10-CM

## 2013-08-06 DIAGNOSIS — R109 Unspecified abdominal pain: Secondary | ICD-10-CM

## 2013-08-08 ENCOUNTER — Other Ambulatory Visit: Payer: BC Managed Care – PPO

## 2013-08-10 ENCOUNTER — Ambulatory Visit
Admission: RE | Admit: 2013-08-10 | Discharge: 2013-08-10 | Disposition: A | Discharge status: Home / Self Care | Payer: BC Managed Care – PPO | Source: Ambulatory Visit | Attending: Internal Medicine | Admitting: Internal Medicine

## 2013-08-10 DIAGNOSIS — R109 Unspecified abdominal pain: Secondary | ICD-10-CM

## 2013-08-13 ENCOUNTER — Encounter (INDEPENDENT_AMBULATORY_CARE_PROVIDER_SITE_OTHER): Payer: BC Managed Care – PPO | Admitting: Physical Therapy

## 2013-08-13 DIAGNOSIS — M7710 Lateral epicondylitis, unspecified elbow: Secondary | ICD-10-CM

## 2013-08-13 DIAGNOSIS — M7700 Medial epicondylitis, unspecified elbow: Secondary | ICD-10-CM

## 2013-08-13 DIAGNOSIS — M2550 Pain in unspecified joint: Secondary | ICD-10-CM

## 2013-12-17 ENCOUNTER — Other Ambulatory Visit: Payer: Self-pay | Admitting: Gastroenterology

## 2013-12-17 DIAGNOSIS — R1011 Right upper quadrant pain: Secondary | ICD-10-CM

## 2013-12-24 ENCOUNTER — Encounter (HOSPITAL_COMMUNITY)
Admission: RE | Admit: 2013-12-24 | Discharge: 2013-12-24 | Disposition: A | Discharge status: Home / Self Care | Payer: BC Managed Care – PPO | Source: Ambulatory Visit | Attending: Gastroenterology | Admitting: Gastroenterology

## 2013-12-24 DIAGNOSIS — R1011 Right upper quadrant pain: Secondary | ICD-10-CM | POA: Insufficient documentation

## 2013-12-24 MED ORDER — SINCALIDE 5 MCG IJ SOLR
0.0200 ug/kg | Freq: Once | INTRAMUSCULAR | Status: AC
  Administered 2013-12-24: 2.59 ug via INTRAVENOUS

## 2013-12-24 MED ORDER — TECHNETIUM TC 99M MEBROFENIN IV KIT
5.0000 | PACK | Freq: Once | INTRAVENOUS | Status: AC | PRN
  Administered 2013-12-24: 5 via INTRAVENOUS

## 2013-12-24 MED ORDER — SINCALIDE 5 MCG IJ SOLR
INTRAMUSCULAR | Status: AC
  Administered 2013-12-24: 2.59 ug via INTRAVENOUS
  Filled 2013-12-24: qty 5

## 2013-12-24 MED ORDER — STERILE WATER FOR INJECTION IJ SOLN
INTRAMUSCULAR | Status: AC
  Filled 2013-12-24: qty 10

## 2014-05-02 ENCOUNTER — Other Ambulatory Visit: Payer: Self-pay | Admitting: Obstetrics and Gynecology

## 2014-05-06 LAB — CYTOLOGY - PAP

## 2015-08-26 DIAGNOSIS — M533 Sacrococcygeal disorders, not elsewhere classified: Secondary | ICD-10-CM | POA: Diagnosis not present

## 2015-09-01 DIAGNOSIS — R05 Cough: Secondary | ICD-10-CM | POA: Diagnosis not present

## 2015-09-01 DIAGNOSIS — J302 Other seasonal allergic rhinitis: Secondary | ICD-10-CM | POA: Diagnosis not present

## 2015-12-15 ENCOUNTER — Other Ambulatory Visit: Payer: Self-pay | Admitting: Obstetrics and Gynecology

## 2015-12-15 DIAGNOSIS — Z6823 Body mass index (BMI) 23.0-23.9, adult: Secondary | ICD-10-CM | POA: Diagnosis not present

## 2015-12-15 DIAGNOSIS — R635 Abnormal weight gain: Secondary | ICD-10-CM | POA: Diagnosis not present

## 2015-12-15 DIAGNOSIS — Z124 Encounter for screening for malignant neoplasm of cervix: Secondary | ICD-10-CM | POA: Diagnosis not present

## 2015-12-15 DIAGNOSIS — Z01419 Encounter for gynecological examination (general) (routine) without abnormal findings: Secondary | ICD-10-CM | POA: Diagnosis not present

## 2015-12-15 DIAGNOSIS — Z1231 Encounter for screening mammogram for malignant neoplasm of breast: Secondary | ICD-10-CM | POA: Diagnosis not present

## 2015-12-16 LAB — CYTOLOGY - PAP

## 2016-02-07 DIAGNOSIS — Z23 Encounter for immunization: Secondary | ICD-10-CM | POA: Diagnosis not present

## 2016-04-30 DIAGNOSIS — M47816 Spondylosis without myelopathy or radiculopathy, lumbar region: Secondary | ICD-10-CM | POA: Diagnosis not present

## 2016-04-30 DIAGNOSIS — M5136 Other intervertebral disc degeneration, lumbar region: Secondary | ICD-10-CM | POA: Diagnosis not present

## 2016-08-09 DIAGNOSIS — M859 Disorder of bone density and structure, unspecified: Secondary | ICD-10-CM | POA: Diagnosis not present

## 2016-08-09 DIAGNOSIS — E784 Other hyperlipidemia: Secondary | ICD-10-CM | POA: Diagnosis not present

## 2016-08-16 DIAGNOSIS — Z0000 Encounter for general adult medical examination without abnormal findings: Secondary | ICD-10-CM | POA: Diagnosis not present

## 2016-08-16 DIAGNOSIS — Z1389 Encounter for screening for other disorder: Secondary | ICD-10-CM | POA: Diagnosis not present

## 2016-08-16 DIAGNOSIS — G629 Polyneuropathy, unspecified: Secondary | ICD-10-CM | POA: Diagnosis not present

## 2016-08-16 DIAGNOSIS — J302 Other seasonal allergic rhinitis: Secondary | ICD-10-CM | POA: Diagnosis not present

## 2016-08-16 DIAGNOSIS — K589 Irritable bowel syndrome without diarrhea: Secondary | ICD-10-CM | POA: Diagnosis not present

## 2016-08-16 DIAGNOSIS — G4700 Insomnia, unspecified: Secondary | ICD-10-CM | POA: Diagnosis not present

## 2016-10-11 DIAGNOSIS — M21611 Bunion of right foot: Secondary | ICD-10-CM | POA: Diagnosis not present

## 2016-10-14 DIAGNOSIS — Z1211 Encounter for screening for malignant neoplasm of colon: Secondary | ICD-10-CM | POA: Diagnosis not present

## 2016-10-14 DIAGNOSIS — K5904 Chronic idiopathic constipation: Secondary | ICD-10-CM | POA: Diagnosis not present

## 2016-10-14 DIAGNOSIS — Z800 Family history of malignant neoplasm of digestive organs: Secondary | ICD-10-CM | POA: Diagnosis not present

## 2016-10-14 DIAGNOSIS — R140 Abdominal distension (gaseous): Secondary | ICD-10-CM | POA: Diagnosis not present

## 2016-12-20 DIAGNOSIS — Z124 Encounter for screening for malignant neoplasm of cervix: Secondary | ICD-10-CM | POA: Diagnosis not present

## 2016-12-20 DIAGNOSIS — Z6822 Body mass index (BMI) 22.0-22.9, adult: Secondary | ICD-10-CM | POA: Diagnosis not present

## 2016-12-20 DIAGNOSIS — Z1231 Encounter for screening mammogram for malignant neoplasm of breast: Secondary | ICD-10-CM | POA: Diagnosis not present

## 2016-12-20 DIAGNOSIS — Z01419 Encounter for gynecological examination (general) (routine) without abnormal findings: Secondary | ICD-10-CM | POA: Diagnosis not present

## 2017-01-06 DIAGNOSIS — L821 Other seborrheic keratosis: Secondary | ICD-10-CM | POA: Diagnosis not present

## 2017-01-06 DIAGNOSIS — Z86018 Personal history of other benign neoplasm: Secondary | ICD-10-CM | POA: Diagnosis not present

## 2017-01-06 DIAGNOSIS — D2262 Melanocytic nevi of left upper limb, including shoulder: Secondary | ICD-10-CM | POA: Diagnosis not present

## 2017-01-06 DIAGNOSIS — L738 Other specified follicular disorders: Secondary | ICD-10-CM | POA: Diagnosis not present

## 2017-01-18 DIAGNOSIS — M21611 Bunion of right foot: Secondary | ICD-10-CM | POA: Diagnosis not present

## 2017-01-18 DIAGNOSIS — G8918 Other acute postprocedural pain: Secondary | ICD-10-CM | POA: Diagnosis not present

## 2017-02-02 DIAGNOSIS — M21611 Bunion of right foot: Secondary | ICD-10-CM | POA: Diagnosis not present

## 2017-02-19 DIAGNOSIS — Z23 Encounter for immunization: Secondary | ICD-10-CM | POA: Diagnosis not present

## 2017-03-02 DIAGNOSIS — M21611 Bunion of right foot: Secondary | ICD-10-CM | POA: Diagnosis not present

## 2017-04-04 DIAGNOSIS — M21611 Bunion of right foot: Secondary | ICD-10-CM | POA: Diagnosis not present

## 2017-04-29 DIAGNOSIS — M5136 Other intervertebral disc degeneration, lumbar region: Secondary | ICD-10-CM | POA: Diagnosis not present

## 2017-04-29 DIAGNOSIS — M47816 Spondylosis without myelopathy or radiculopathy, lumbar region: Secondary | ICD-10-CM | POA: Diagnosis not present

## 2017-05-20 DIAGNOSIS — Z1211 Encounter for screening for malignant neoplasm of colon: Secondary | ICD-10-CM | POA: Diagnosis not present

## 2017-05-20 DIAGNOSIS — Z800 Family history of malignant neoplasm of digestive organs: Secondary | ICD-10-CM | POA: Diagnosis not present

## 2017-08-22 DIAGNOSIS — M859 Disorder of bone density and structure, unspecified: Secondary | ICD-10-CM | POA: Diagnosis not present

## 2017-08-22 DIAGNOSIS — Z0000 Encounter for general adult medical examination without abnormal findings: Secondary | ICD-10-CM | POA: Diagnosis not present

## 2017-08-22 DIAGNOSIS — R82998 Other abnormal findings in urine: Secondary | ICD-10-CM | POA: Diagnosis not present

## 2017-08-22 DIAGNOSIS — E7849 Other hyperlipidemia: Secondary | ICD-10-CM | POA: Diagnosis not present

## 2017-10-28 DIAGNOSIS — Z1389 Encounter for screening for other disorder: Secondary | ICD-10-CM | POA: Diagnosis not present

## 2017-10-28 DIAGNOSIS — Z0000 Encounter for general adult medical examination without abnormal findings: Secondary | ICD-10-CM | POA: Diagnosis not present

## 2017-10-28 DIAGNOSIS — K589 Irritable bowel syndrome without diarrhea: Secondary | ICD-10-CM | POA: Diagnosis not present

## 2017-10-28 DIAGNOSIS — M859 Disorder of bone density and structure, unspecified: Secondary | ICD-10-CM | POA: Diagnosis not present

## 2017-10-28 DIAGNOSIS — E7849 Other hyperlipidemia: Secondary | ICD-10-CM | POA: Diagnosis not present

## 2017-10-28 DIAGNOSIS — R945 Abnormal results of liver function studies: Secondary | ICD-10-CM | POA: Diagnosis not present

## 2018-01-12 DIAGNOSIS — Z01419 Encounter for gynecological examination (general) (routine) without abnormal findings: Secondary | ICD-10-CM | POA: Diagnosis not present

## 2018-01-12 DIAGNOSIS — Z1231 Encounter for screening mammogram for malignant neoplasm of breast: Secondary | ICD-10-CM | POA: Diagnosis not present

## 2018-01-12 DIAGNOSIS — Z124 Encounter for screening for malignant neoplasm of cervix: Secondary | ICD-10-CM | POA: Diagnosis not present

## 2018-01-12 DIAGNOSIS — Z6821 Body mass index (BMI) 21.0-21.9, adult: Secondary | ICD-10-CM | POA: Diagnosis not present

## 2018-05-10 DIAGNOSIS — M47816 Spondylosis without myelopathy or radiculopathy, lumbar region: Secondary | ICD-10-CM | POA: Diagnosis not present

## 2018-06-16 DIAGNOSIS — M5136 Other intervertebral disc degeneration, lumbar region: Secondary | ICD-10-CM | POA: Diagnosis not present

## 2018-06-16 DIAGNOSIS — M47816 Spondylosis without myelopathy or radiculopathy, lumbar region: Secondary | ICD-10-CM | POA: Diagnosis not present

## 2018-11-25 DIAGNOSIS — Z1159 Encounter for screening for other viral diseases: Secondary | ICD-10-CM | POA: Diagnosis not present

## 2018-11-25 DIAGNOSIS — Z20828 Contact with and (suspected) exposure to other viral communicable diseases: Secondary | ICD-10-CM | POA: Diagnosis not present

## 2018-12-01 DIAGNOSIS — Z0000 Encounter for general adult medical examination without abnormal findings: Secondary | ICD-10-CM | POA: Diagnosis not present

## 2018-12-01 DIAGNOSIS — E7849 Other hyperlipidemia: Secondary | ICD-10-CM | POA: Diagnosis not present

## 2018-12-01 DIAGNOSIS — M859 Disorder of bone density and structure, unspecified: Secondary | ICD-10-CM | POA: Diagnosis not present

## 2018-12-01 DIAGNOSIS — Z23 Encounter for immunization: Secondary | ICD-10-CM | POA: Diagnosis not present

## 2018-12-02 DIAGNOSIS — Z20828 Contact with and (suspected) exposure to other viral communicable diseases: Secondary | ICD-10-CM | POA: Diagnosis not present

## 2018-12-04 DIAGNOSIS — E785 Hyperlipidemia, unspecified: Secondary | ICD-10-CM | POA: Diagnosis not present

## 2018-12-04 DIAGNOSIS — M8580 Other specified disorders of bone density and structure, unspecified site: Secondary | ICD-10-CM | POA: Diagnosis not present

## 2018-12-04 DIAGNOSIS — K589 Irritable bowel syndrome without diarrhea: Secondary | ICD-10-CM | POA: Diagnosis not present

## 2018-12-04 DIAGNOSIS — Z1331 Encounter for screening for depression: Secondary | ICD-10-CM | POA: Diagnosis not present

## 2018-12-04 DIAGNOSIS — Z0000 Encounter for general adult medical examination without abnormal findings: Secondary | ICD-10-CM | POA: Diagnosis not present

## 2018-12-04 DIAGNOSIS — G4700 Insomnia, unspecified: Secondary | ICD-10-CM | POA: Diagnosis not present

## 2019-02-15 DIAGNOSIS — L821 Other seborrheic keratosis: Secondary | ICD-10-CM | POA: Diagnosis not present

## 2019-02-15 DIAGNOSIS — L309 Dermatitis, unspecified: Secondary | ICD-10-CM | POA: Diagnosis not present

## 2019-02-21 DIAGNOSIS — Z01419 Encounter for gynecological examination (general) (routine) without abnormal findings: Secondary | ICD-10-CM | POA: Diagnosis not present

## 2019-02-21 DIAGNOSIS — Z23 Encounter for immunization: Secondary | ICD-10-CM | POA: Diagnosis not present

## 2019-02-21 DIAGNOSIS — Z6821 Body mass index (BMI) 21.0-21.9, adult: Secondary | ICD-10-CM | POA: Diagnosis not present

## 2019-02-21 DIAGNOSIS — Z1231 Encounter for screening mammogram for malignant neoplasm of breast: Secondary | ICD-10-CM | POA: Diagnosis not present

## 2019-02-21 DIAGNOSIS — Z124 Encounter for screening for malignant neoplasm of cervix: Secondary | ICD-10-CM | POA: Diagnosis not present

## 2019-05-04 DIAGNOSIS — M47816 Spondylosis without myelopathy or radiculopathy, lumbar region: Secondary | ICD-10-CM | POA: Diagnosis not present

## 2019-12-28 DIAGNOSIS — E785 Hyperlipidemia, unspecified: Secondary | ICD-10-CM | POA: Diagnosis not present

## 2019-12-28 DIAGNOSIS — E7849 Other hyperlipidemia: Secondary | ICD-10-CM | POA: Diagnosis not present

## 2019-12-28 DIAGNOSIS — Z0000 Encounter for general adult medical examination without abnormal findings: Secondary | ICD-10-CM | POA: Diagnosis not present

## 2019-12-28 DIAGNOSIS — Z23 Encounter for immunization: Secondary | ICD-10-CM | POA: Diagnosis not present

## 2020-02-26 DIAGNOSIS — Z1231 Encounter for screening mammogram for malignant neoplasm of breast: Secondary | ICD-10-CM | POA: Diagnosis not present

## 2020-02-26 DIAGNOSIS — Z01419 Encounter for gynecological examination (general) (routine) without abnormal findings: Secondary | ICD-10-CM | POA: Diagnosis not present

## 2020-02-26 DIAGNOSIS — Z124 Encounter for screening for malignant neoplasm of cervix: Secondary | ICD-10-CM | POA: Diagnosis not present

## 2020-03-26 DIAGNOSIS — M7062 Trochanteric bursitis, left hip: Secondary | ICD-10-CM | POA: Diagnosis not present

## 2020-03-26 DIAGNOSIS — M47816 Spondylosis without myelopathy or radiculopathy, lumbar region: Secondary | ICD-10-CM | POA: Diagnosis not present

## 2020-03-26 DIAGNOSIS — M7061 Trochanteric bursitis, right hip: Secondary | ICD-10-CM | POA: Diagnosis not present

## 2020-03-26 DIAGNOSIS — M7918 Myalgia, other site: Secondary | ICD-10-CM | POA: Diagnosis not present

## 2020-04-03 DIAGNOSIS — M5416 Radiculopathy, lumbar region: Secondary | ICD-10-CM | POA: Diagnosis not present

## 2022-11-03 ENCOUNTER — Other Ambulatory Visit: Payer: Self-pay

## 2022-11-03 DIAGNOSIS — Z0000 Encounter for general adult medical examination without abnormal findings: Secondary | ICD-10-CM

## 2022-11-03 DIAGNOSIS — E785 Hyperlipidemia, unspecified: Secondary | ICD-10-CM

## 2022-11-17 ENCOUNTER — Other Ambulatory Visit (HOSPITAL_BASED_OUTPATIENT_CLINIC_OR_DEPARTMENT_OTHER): Payer: Self-pay | Admitting: Internal Medicine

## 2022-11-17 DIAGNOSIS — E785 Hyperlipidemia, unspecified: Secondary | ICD-10-CM

## 2022-11-17 DIAGNOSIS — Z0000 Encounter for general adult medical examination without abnormal findings: Secondary | ICD-10-CM

## 2022-12-07 ENCOUNTER — Ambulatory Visit (HOSPITAL_BASED_OUTPATIENT_CLINIC_OR_DEPARTMENT_OTHER)
Admission: RE | Admit: 2022-12-07 | Discharge: 2022-12-07 | Disposition: A | Discharge status: Home / Self Care | Payer: Self-pay | Source: Ambulatory Visit | Attending: Internal Medicine | Admitting: Internal Medicine

## 2022-12-07 DIAGNOSIS — E785 Hyperlipidemia, unspecified: Secondary | ICD-10-CM | POA: Insufficient documentation

## 2023-09-09 ENCOUNTER — Telehealth: Payer: Self-pay | Admitting: Genetic Counselor

## 2023-09-09 NOTE — Telephone Encounter (Signed)
 Per scheduler Jearlean Mince, patient had questions regarding genetic counseling/testing (cost, process, etc).  Called patient and LVM with CB instructions.

## 2023-09-09 NOTE — Telephone Encounter (Signed)
 Called patient because had questions regarding genetic testing/counseling.  Discussed insurance coverage for testing.  Discussed that there could also be an OOP cost for appt itself.  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  Discussed MyChart of telephone visit options with option to send saliva kit to home address or set up blood draw at [facility] for blood draw.  Patient wants to think more about it and said she will call back if she wishes to schedule appt.
